# Patient Record
Sex: Male | Born: 1955 | Race: White | Hispanic: No | Marital: Married | Smoking: Former smoker
Health system: Southern US, Community
[De-identification: ages and names within clinical notes are randomized; demographics above are authoritative.]

## PROBLEM LIST (undated history)

## (undated) DIAGNOSIS — K284 Chronic or unspecified gastrojejunal ulcer with hemorrhage: Secondary | ICD-10-CM

## (undated) DIAGNOSIS — K274 Chronic or unspecified peptic ulcer, site unspecified, with hemorrhage: Secondary | ICD-10-CM

## (undated) HISTORY — PX: VASECTOMY: SHX75

---

## 2010-08-28 HISTORY — PX: OTHER SURGICAL HISTORY: SHX169

## 2012-03-25 ENCOUNTER — Emergency Department (HOSPITAL_BASED_OUTPATIENT_CLINIC_OR_DEPARTMENT_OTHER): Payer: BC Managed Care – PPO

## 2012-03-25 ENCOUNTER — Emergency Department (HOSPITAL_BASED_OUTPATIENT_CLINIC_OR_DEPARTMENT_OTHER)
Admission: EM | Admit: 2012-03-25 | Discharge: 2012-03-25 | Disposition: A | Discharge status: Home / Self Care | Payer: BC Managed Care – PPO | Attending: Emergency Medicine | Admitting: Emergency Medicine

## 2012-03-25 ENCOUNTER — Encounter (HOSPITAL_BASED_OUTPATIENT_CLINIC_OR_DEPARTMENT_OTHER): Payer: Self-pay

## 2012-03-25 DIAGNOSIS — F17200 Nicotine dependence, unspecified, uncomplicated: Secondary | ICD-10-CM | POA: Insufficient documentation

## 2012-03-25 DIAGNOSIS — R079 Chest pain, unspecified: Secondary | ICD-10-CM | POA: Insufficient documentation

## 2012-03-25 HISTORY — DX: Chronic or unspecified peptic ulcer, site unspecified, with hemorrhage: K27.4

## 2012-03-25 HISTORY — DX: Chronic or unspecified gastrojejunal ulcer with hemorrhage: K28.4

## 2012-03-25 LAB — CBC WITH DIFFERENTIAL/PLATELET
Eosinophils Relative: 3 % (ref 0–5)
HCT: 40.4 % (ref 39.0–52.0)
Lymphocytes Relative: 33 % (ref 12–46)
Lymphs Abs: 2.2 10*3/uL (ref 0.7–4.0)
MCV: 85.4 fL (ref 78.0–100.0)
Monocytes Absolute: 0.7 10*3/uL (ref 0.1–1.0)
Neutro Abs: 3.6 10*3/uL (ref 1.7–7.7)
RBC: 4.73 MIL/uL (ref 4.22–5.81)
WBC: 6.7 10*3/uL (ref 4.0–10.5)

## 2012-03-25 LAB — BASIC METABOLIC PANEL
CO2: 27 mEq/L (ref 19–32)
Calcium: 9 mg/dL (ref 8.4–10.5)
Chloride: 102 mEq/L (ref 96–112)
Glucose, Bld: 88 mg/dL (ref 70–99)
Sodium: 138 mEq/L (ref 135–145)

## 2012-03-25 LAB — TROPONIN I
Troponin I: <0.3 ng/mL (ref <0.30)
Troponin I: <0.3 ng/mL (ref <0.30)

## 2012-03-25 MED ORDER — NITROGLYCERIN 0.4 MG SL SUBL
0.4000 mg | SUBLINGUAL_TABLET | SUBLINGUAL | Status: DC | PRN
  Administered 2012-03-25: 0.4 mg via SUBLINGUAL
  Filled 2012-03-25: qty 25

## 2012-03-25 MED ORDER — ASPIRIN 81 MG PO CHEW
324.0000 mg | CHEWABLE_TABLET | Freq: Once | ORAL | Status: AC
  Administered 2012-03-25: 324 mg via ORAL
  Filled 2012-03-25: qty 4

## 2012-03-25 NOTE — ED Provider Notes (Signed)
History     CSN: [id number]  Arrival date & time 03/25/12  1208   First MD Initiated Contact with Patient 03/25/12 1228      Chief Complaint  Patient presents with  . Chest Pain    (Consider location/radiation/quality/duration/timing/severity/associated sxs/prior treatment) HPI Patient is a 75 your old male who presents complaining of 2/10 left chest pressure that developed last night around 9 PM and has been persistent since that time. He has no associated shortness of breath, nausea, vomiting, or neurologic changes. Patient denies any abdominal pain, fevers, recent cough, or variation in his pain with deep inspiration. He has no history of coronary artery disease or thromboembolic disease. Patient has no history of hypertension, hyperlipidemia, smoking, diabetes, or early family history of CAD. Nothing has made the pain better or worse. Patient has no prior history of stress testing or evaluation by cardiologist. He came in today as he stated that he just wanted to be checked out. Past Medical History  Diagnosis Date  . Bleeding ulcer     Past Surgical History  Procedure Date  . Bleeding ulcer cauterized     History reviewed. No pertinent family history.  History  Substance Use Topics  . Smoking status: Current Some Day Smoker    Types: Cigars  . Smokeless tobacco: Not on file  . Alcohol Use: Yes      Review of Systems  Constitutional: Negative.   HENT: Negative.   Eyes: Negative.   Respiratory: Negative.   Cardiovascular: Positive for chest pain.  Gastrointestinal: Negative.   Genitourinary: Negative.   Musculoskeletal: Negative.   Skin: Negative.   Neurological: Negative.   Hematological: Negative.   Psychiatric/Behavioral: Negative.   All other systems reviewed and are negative.    Allergies  Review of patient's allergies indicates no known allergies.  Home Medications   Current Outpatient Rx  Name Route Sig Dispense Refill  . NAPROXEN SODIUM 220 MG  PO TABS Oral Take 220 mg by mouth 2 (two) times daily with a meal. Back pain      BP 122/55  Pulse 63  Temp 98.9 F (37.2 C) (Oral)  Resp 16  Ht 5\' 8"  (1.727 m)  Wt 175 lb (79.379 kg)  BMI 26.61 kg/m2  SpO2 100%  Physical Exam  Nursing note and vitals reviewed. GEN: Well-developed, well-nourished male in no distress HEENT: Atraumatic, normocephalic. Oropharynx clear without erythema EYES: PERRLA BL, no scleral icterus. NECK: Trachea midline, no meningismus CV: regular rate and rhythm. No murmurs, rubs, or gallops PULM: No respiratory distress.  No crackles, wheezes, or rales. GI: soft, non-tender. No guarding, rebound, or tenderness. + bowel sounds  GU: deferred Neuro: cranial nerves grossly 2-12 intact, no abnormalities of strength or sensation, A and O x 3 MSK: Patient moves all 4 extremities symmetrically, no deformity, edema, or injury noted Skin: No rashes petechiae, purpura, or jaundice Psych: no abnormality of mood   ED Course  Procedures (including critical care time)  Indication: chest pain Please note this EKG was reviewed extemporaneously by myself.  Date: 03/25/2012  Rate: 74  Rhythm: normal sinus rhythm and sinus arrhythmia  QRS Axis: normal  Intervals: normal  ST/T Wave abnormalities: nonspecific T wave changes  Conduction Disutrbances:none  Narrative Interpretation:   Old EKG Reviewed: none available     Labs Reviewed  CBC WITH DIFFERENTIAL - Abnormal; Notable for the following:    MCHC 36.1 (*)     All other components within normal limits  BASIC METABOLIC PANEL  TROPONIN I  TROPONIN I   Dg Chest 2 View  03/25/2012  *RADIOLOGY REPORT*  Clinical Data: chest tightness  CHEST - 2 VIEW  Comparison: None.  Findings: The heart size and mediastinal contours are within normal limits.  Both lungs are clear.  The visualized skeletal structures are unremarkable.  IMPRESSION: Negative exam.  Original Report Authenticated By: Rosealee Albee, M.D.      1. Chest pain       MDM  Patient was evaluated by myself. He was hemodynamically stable with only 2/10 left sided chest pain. He was given a dose of aspirin 1 tab of nitroglycerin. Initial laboratory workup for ACS was negative. Chest x-ray was clear as well and EKG showed no concerning changes. Patient has no significant risk factors aside from his age and being male for coronary artery disease. Patient had had constant slight left-sided chest pain which did not fit with typical ACS. Patient did not have presentation concerning for thromboembolic disease. At this time 3 hour troponins pending. If this returned negative patient would be safe for discharge to home with followup with his primary care physician. He's been notified of plan. Three-hour troponin will be due at 3:30 PM.  4:08 PM 3 arch was negative. Patient pain free. He was advised to follow his primary care physician regarding stress testing. If he develops chest pain again he is welcome to return for further evaluation. Patient I discussed the risk chest pain protocol admission and patient preferred outpatient followup with his established primary care physician.  Cyndra Numbers, MD 03/25/12 [phone]

## 2012-03-25 NOTE — ED Notes (Signed)
Left side CP started last night 9pm-pt was able to sleep but woke with pain this am-denies sob, n/v, diaphoresis

## 2018-09-13 ENCOUNTER — Ambulatory Visit (HOSPITAL_BASED_OUTPATIENT_CLINIC_OR_DEPARTMENT_OTHER)
Admission: RE | Admit: 2018-09-13 | Discharge: 2018-09-13 | Disposition: A | Discharge status: Home / Self Care | Payer: 59 | Source: Ambulatory Visit | Attending: Osteopathic Medicine | Admitting: Osteopathic Medicine

## 2018-09-13 ENCOUNTER — Encounter (HOSPITAL_BASED_OUTPATIENT_CLINIC_OR_DEPARTMENT_OTHER): Payer: Self-pay

## 2018-09-13 ENCOUNTER — Other Ambulatory Visit (HOSPITAL_BASED_OUTPATIENT_CLINIC_OR_DEPARTMENT_OTHER): Payer: Self-pay | Admitting: Osteopathic Medicine

## 2018-09-13 DIAGNOSIS — N5082 Scrotal pain: Secondary | ICD-10-CM

## 2018-09-13 DIAGNOSIS — N5089 Other specified disorders of the male genital organs: Secondary | ICD-10-CM

## 2021-04-10 ENCOUNTER — Emergency Department (HOSPITAL_BASED_OUTPATIENT_CLINIC_OR_DEPARTMENT_OTHER)
Admission: EM | Admit: 2021-04-10 | Discharge: 2021-04-10 | Disposition: A | Discharge status: Home / Self Care | Payer: 59 | Attending: Emergency Medicine | Admitting: Emergency Medicine

## 2021-04-10 ENCOUNTER — Other Ambulatory Visit: Payer: Self-pay

## 2021-04-10 ENCOUNTER — Encounter (HOSPITAL_BASED_OUTPATIENT_CLINIC_OR_DEPARTMENT_OTHER): Payer: Self-pay

## 2021-04-10 ENCOUNTER — Emergency Department (HOSPITAL_BASED_OUTPATIENT_CLINIC_OR_DEPARTMENT_OTHER): Payer: 59

## 2021-04-10 DIAGNOSIS — I10 Essential (primary) hypertension: Secondary | ICD-10-CM

## 2021-04-10 DIAGNOSIS — R0781 Pleurodynia: Secondary | ICD-10-CM | POA: Insufficient documentation

## 2021-04-10 DIAGNOSIS — Z87891 Personal history of nicotine dependence: Secondary | ICD-10-CM | POA: Diagnosis not present

## 2021-04-10 DIAGNOSIS — R059 Cough, unspecified: Secondary | ICD-10-CM | POA: Diagnosis not present

## 2021-04-10 LAB — CBC WITH DIFFERENTIAL/PLATELET
Abs Immature Granulocytes: 0.03 10*3/uL (ref 0.00–0.07)
Basophils Absolute: 0.1 10*3/uL (ref 0.0–0.1)
Basophils Relative: 1 %
Eosinophils Absolute: 0.2 10*3/uL (ref 0.0–0.5)
Eosinophils Relative: 3 %
HCT: 41.5 % (ref 39.0–52.0)
Hemoglobin: 14.7 g/dL (ref 13.0–17.0)
Immature Granulocytes: 0 %
Lymphocytes Relative: 20 %
Lymphs Abs: 1.5 10*3/uL (ref 0.7–4.0)
MCH: 30.9 pg (ref 26.0–34.0)
MCHC: 35.4 g/dL (ref 30.0–36.0)
MCV: 87.4 fL (ref 80.0–100.0)
Monocytes Absolute: 1.1 10*3/uL — ABNORMAL HIGH (ref 0.1–1.0)
Monocytes Relative: 14 %
Neutro Abs: 4.5 10*3/uL (ref 1.7–7.7)
Neutrophils Relative %: 62 %
Platelets: 205 10*3/uL (ref 150–400)
RBC: 4.75 MIL/uL (ref 4.22–5.81)
RDW: 12.3 % (ref 11.5–15.5)
WBC: 7.3 10*3/uL (ref 4.0–10.5)
nRBC: 0 % (ref 0.0–0.2)

## 2021-04-10 LAB — URINALYSIS, ROUTINE W REFLEX MICROSCOPIC
Bilirubin Urine: NEGATIVE
Glucose, UA: NEGATIVE mg/dL
Ketones, ur: NEGATIVE mg/dL
Leukocytes,Ua: NEGATIVE
Nitrite: NEGATIVE
Protein, ur: NEGATIVE mg/dL
Specific Gravity, Urine: <1.005 — ABNORMAL LOW (ref 1.005–1.030)
pH: 6 (ref 5.0–8.0)

## 2021-04-10 LAB — COMPREHENSIVE METABOLIC PANEL
ALT: 22 U/L (ref 0–44)
AST: 27 U/L (ref 15–41)
Albumin: 4.2 g/dL (ref 3.5–5.0)
Alkaline Phosphatase: 50 U/L (ref 38–126)
Anion gap: 8 (ref 5–15)
BUN: 11 mg/dL (ref 8–23)
CO2: 28 mmol/L (ref 22–32)
Calcium: 8.4 mg/dL — ABNORMAL LOW (ref 8.9–10.3)
Chloride: 101 mmol/L (ref 98–111)
Creatinine, Ser: 0.67 mg/dL (ref 0.61–1.24)
GFR, Estimated: >60 mL/min (ref >60)
Glucose, Bld: 83 mg/dL (ref 70–99)
Potassium: 4 mmol/L (ref 3.5–5.1)
Sodium: 137 mmol/L (ref 135–145)
Total Bilirubin: 0.4 mg/dL (ref 0.3–1.2)
Total Protein: 7.4 g/dL (ref 6.5–8.1)

## 2021-04-10 LAB — URINALYSIS, MICROSCOPIC (REFLEX)

## 2021-04-10 LAB — TROPONIN I (HIGH SENSITIVITY)
Troponin I (High Sensitivity): 3 ng/L (ref <18)
Troponin I (High Sensitivity): 3 ng/L (ref <18)

## 2021-04-10 MED ORDER — ACETAMINOPHEN-CODEINE 120-12 MG/5ML PO SOLN: 5.0000 mL | Freq: Four times a day (QID) | ORAL | 0 refills | Status: DC | PRN

## 2021-04-10 MED ORDER — BENZONATATE 100 MG PO CAPS
100.0000 mg | ORAL_CAPSULE | Freq: Once | ORAL | Status: AC
  Administered 2021-04-10: 100 mg via ORAL
  Filled 2021-04-10: qty 1

## 2021-04-10 MED ORDER — HYDRALAZINE HCL 20 MG/ML IJ SOLN
10.0000 mg | Freq: Once | INTRAMUSCULAR | Status: AC
  Administered 2021-04-10: 10 mg via INTRAVENOUS
  Filled 2021-04-10: qty 1

## 2021-04-10 MED ORDER — GUAIFENESIN 100 MG/5ML PO LIQD: 100.0000 mg | ORAL | 0 refills | Status: DC | PRN

## 2021-04-10 MED ORDER — IOHEXOL 350 MG/ML SOLN
100.0000 mL | Freq: Once | INTRAVENOUS | Status: AC | PRN
  Administered 2021-04-10: 100 mL via INTRAVENOUS

## 2021-04-10 MED ORDER — MORPHINE SULFATE (PF) 4 MG/ML IV SOLN
4.0000 mg | Freq: Once | INTRAVENOUS | Status: AC
  Administered 2021-04-10: 4 mg via INTRAVENOUS
  Filled 2021-04-10: qty 1

## 2021-04-10 NOTE — ED Provider Notes (Signed)
[facility] EMERGENCY DEPARTMENT Provider Note   CSN: [id number] Arrival date & time: 04/10/21  1806     History Chief Complaint  Patient presents with   Cough    Jonathan Velasquez is a 65 y.o. male history of bleeding ulcer, here presenting with cough and chest pain and rib pain.  Patient states that he has been having nonproductive cough for the last week or so.  He did test himself for COVID and was -4 days ago. He states that today he had a coughing spell and had a sudden onset of left chest pain.  He states that it radiates to his back.  He states that he just felt very uncomfortable.  Denies any urinary symptoms.  Denies any history of CAD or stents.  The history is provided by the patient.      Past Medical History:  Diagnosis Date   Bleeding ulcer     There are no problems to display for this patient.   Past Surgical History:  Procedure Laterality Date   bleeding ulcer cauterized  2012   VASECTOMY         No family history on file.  Social History   Tobacco Use   Smoking status: Former    Packs/day: 0.01    Types: Cigars, Cigarettes    Quit date: 2014    Years since quitting: 8.6   Smokeless tobacco: Never   Tobacco comments:    one cigar a month  Vaping Use   Vaping Use: Never used  Substance Use Topics   Alcohol use: Yes    Comment: socially   Drug use: No    Home Medications Prior to Admission medications   Medication Sig Start Date End Date Taking? Authorizing Provider  naproxen sodium (ANAPROX) 220 MG tablet Take 220 mg by mouth 2 (two) times daily with a meal. Back pain    [provider]    Allergies    Patient has no known allergies.  Review of Systems   Review of Systems  Respiratory:  Positive for cough.   Cardiovascular:  Positive for chest pain.  All other systems reviewed and are negative.  Physical Exam Updated Vital Signs BP 113/71   Pulse 85   Temp 99.1 F (37.3 C) (Oral)   Resp 15   Ht 5\' 6"  (1.676  m)   Wt 81.6 kg   SpO2 96%   BMI 29.05 kg/m   Physical Exam Vitals and nursing note reviewed.  Constitutional:      Comments: Uncomfortable  HENT:     Head: Normocephalic.     Nose: Nose normal.     Mouth/Throat:     Mouth: Mucous membranes are moist.  Eyes:     Extraocular Movements: Extraocular movements intact.     Pupils: Pupils are equal, round, and reactive to light.  Cardiovascular:     Rate and Rhythm: Normal rate and regular rhythm.     Pulses: Normal pulses.     Heart sounds: Normal heart sounds.  Pulmonary:     Effort: Pulmonary effort is normal.     Breath sounds: Normal breath sounds.     Comments: Reproducible left chest wall and back tenderness Abdominal:     General: Abdomen is flat.     Palpations: Abdomen is soft.  Musculoskeletal:        General: Normal range of motion.     Cervical back: Normal range of motion and neck supple.  Skin:  General: Skin is warm.     Capillary Refill: Capillary refill takes less than 2 seconds.  Neurological:     General: No focal deficit present.     Mental Status: He is oriented to person, place, and time.  Psychiatric:        Mood and Affect: Mood normal.        Behavior: Behavior normal.    ED Results / Procedures / Treatments   Labs (all labs ordered are listed, but only abnormal results are displayed) Labs Reviewed  CBC WITH DIFFERENTIAL/PLATELET - Abnormal; Notable for the following components:      Result Value   Monocytes Absolute 1.1 (*)    All other components within normal limits  COMPREHENSIVE METABOLIC PANEL - Abnormal; Notable for the following components:   Calcium 8.4 (*)    All other components within normal limits  URINALYSIS, ROUTINE W REFLEX MICROSCOPIC - Abnormal; Notable for the following components:   Color, Urine STRAW (*)    Specific Gravity, Urine <1.005 (*)    Hgb urine dipstick SMALL (*)    All other components within normal limits  URINALYSIS, MICROSCOPIC (REFLEX) - Abnormal;  Notable for the following components:   Bacteria, UA FEW (*)    All other components within normal limits  TROPONIN I (HIGH SENSITIVITY)  TROPONIN I (HIGH SENSITIVITY)    EKG EKG Interpretation  Date/Time:  Sunday April 10 2021 20:08:34 EDT Ventricular Rate:  68 PR Interval:  119 QRS Duration: 104 QT Interval:  420 QTC Calculation: 447 R Axis:   -3 Text Interpretation: Sinus rhythm Borderline short PR interval Minimal ST elevation, inferior leads No significant change since last tracing Confirmed by Richardean Canal [phone]) on 04/10/2021 8:12:04 PM  Radiology CT Angio Chest/Abd/Pel for Dissection W and/or Wo Contrast  Result Date: 04/10/2021 CLINICAL DATA:  Abdominal pain, aortic dissection suspected EXAM: CT ANGIOGRAPHY CHEST, ABDOMEN AND PELVIS TECHNIQUE: Non-contrast CT of the chest was initially obtained. Multidetector CT imaging through the chest, abdomen and pelvis was performed using the standard protocol during bolus administration of intravenous contrast. Multiplanar reconstructed images and MIPs were obtained and reviewed to evaluate the vascular anatomy. CONTRAST:  OMNIPAQUE IOHEXOL 350 MG/ML SOLN COMPARISON:  None. FINDINGS: CTA CHEST FINDINGS Cardiovascular: --Heart: The heart size is normal.  There is nopericardial effusion. --Aorta: The course and caliber of the thoracic aorta are normal. There is mild aortic atherosclerotic calcification. Precontrast images show no aortic intramural hematoma. There is no blood pool, dissection or penetrating ulcer demonstrated on arterial phase postcontrast imaging. There is a conventional 3 vessel aortic arch branching pattern. The proximal arch vessels are widely patent. --Pulmonary Arteries: Contrast timing is optimized for preferential opacification of the aorta. Within that limitation, normal central pulmonary arteries. Mediastinum/Nodes: No mediastinal, hilar or axillary lymphadenopathy. The visualized thyroid and thoracic esophageal  course are unremarkable. Lungs/Pleura: No pulmonary nodules or masses. No pleural effusion or pneumothorax. No focal airspace consolidation. No focal pleural abnormality. Musculoskeletal: No chest wall abnormality. No acute osseous findings. Review of the MIP images confirms the above findings. CTA ABDOMEN AND PELVIS FINDINGS VASCULAR Aorta: Normal caliber aorta without aneurysm, dissection, vasculitis or hemodynamically significant stenosis. There is no aortic atherosclerosis. Celiac: No aneurysm, dissection or hemodynamically significant stenosis. Normal branching pattern. SMA: Widely patent without dissection or stenosis. Renals: Single renal arteries bilaterally. No aneurysm, dissection, stenosis or evidence of fibromuscular dysplasia. IMA: Patent without abnormality. Inflow: No aneurysm, stenosis or dissection. Veins: Normal course and caliber of the major  veins. Assessment is otherwise limited by the arterial dominant contrast phase. Review of the MIP images confirms the above findings. NON-VASCULAR Hepatobiliary: Normal hepatic contours and density. No visible biliary dilatation. Normal gallbladder. Pancreas: Normal contours without ductal dilatation. No peripancreatic fluid collection. Spleen: Normal arterial phase splenic enhancement pattern. Adrenals/Urinary Tract: --Adrenal glands: Normal. --Right kidney/ureter: No hydronephrosis or perinephric stranding. No nephrolithiasis. No obstructing ureteral stones. --Left kidney/ureter: No hydronephrosis or perinephric stranding. No nephrolithiasis. No obstructing ureteral stones. --Urinary bladder: Unremarkable. Stomach/Bowel: --Stomach/Duodenum: No hiatal hernia or other gastric abnormality. Normal duodenal course and caliber. --Small bowel: No dilatation or inflammation. --Colon: No focal abnormality. --Appendix: Normal. Lymphatic:  No abdominal or pelvic lymphadenopathy. Reproductive: No free fluid in the pelvis. Musculoskeletal. No bony spinal canal stenosis  or focal osseous abnormality. Other: None. Review of the MIP images confirms the above findings. findings IMPRESSION: No acute aortic syndrome or other acute abnormality of the chest, abdomen or pelvis. Aortic atherosclerosis (ICD10-I70.0). Electronically Signed   By: Deatra Robinson M.D.   On: 04/10/2021 21:16    Procedures Procedures   Medications Ordered in ED Medications  hydrALAZINE (APRESOLINE) injection 10 mg (10 mg Intravenous Given 04/10/21 2008)  morphine 4 MG/ML injection 4 mg (4 mg Intravenous Given 04/10/21 2008)  benzonatate (TESSALON) capsule 100 mg (100 mg Oral Given 04/10/21 2036)  iohexol (OMNIPAQUE) 350 MG/ML injection 100 mL (100 mLs Intravenous Contrast Given 04/10/21 2047)    ED Course  I have reviewed the triage vital signs and the nursing notes.  Pertinent labs & imaging results that were available during my care of the patient were reviewed by me and considered in my medical decision making (see chart for details).    MDM Rules/Calculators/A&P                          DALLAS TOROK is a 65 y.o. male here presenting with left-sided chest pain after cough.  I think likely muscle strain.  However patient is hypertensive and does not have a history of hypertension.  He also appears very comfortable.  I am concerned for possible dissection versus renal colic.  We will get CT dissection study and CBC and CMP and troponin and urinalysis.  Will give pain medicine and hydralazine and reassess   10:53 PM Blood pressure down to 113/71 now.  Troponin negative x2.  Dissection study negative, urinalysis normal.  At this point, I think he can be discharged.  We will give him Tylenol with codeine to help with pain and also coughing.  We will have him follow-up with PCP regarding his blood pressure  Final Clinical Impression(s) / ED Diagnoses Final diagnoses:  None    Rx / DC Orders ED Discharge Orders     None        Charlynne Pander, MD 04/10/21 2254

## 2021-04-10 NOTE — ED Triage Notes (Signed)
Pt arrives ambulatory to ED with c/o cough X1 week states today he coughed and sneezed at the same time states he felt a pop and has had pain to left back rib since.

## 2021-04-10 NOTE — Discharge Instructions (Addendum)
Take Tylenol with codeine for cough and pain.  Do not drive with it   Your elevated blood pressure is likely from pain and coughing.  Please recheck blood pressure with your doctor in a week  Your heart enzyme tests are normal today  See your doctor  Return to ER if you have worse cough, trouble breathing, chest pain, back pain

## 2022-01-30 ENCOUNTER — Emergency Department (INDEPENDENT_AMBULATORY_CARE_PROVIDER_SITE_OTHER)
Admission: EM | Admit: 2022-01-30 | Discharge: 2022-01-30 | Disposition: A | Discharge status: Home / Self Care | Payer: 59 | Source: Home / Self Care

## 2022-01-30 ENCOUNTER — Encounter: Payer: Self-pay | Admitting: Emergency Medicine

## 2022-01-30 DIAGNOSIS — S81812A Laceration without foreign body, left lower leg, initial encounter: Secondary | ICD-10-CM | POA: Diagnosis not present

## 2022-01-30 NOTE — ED Provider Notes (Signed)
[facility]    CSN: [id number] Arrival date & time: 01/30/22  1647      History   Chief Complaint Chief Complaint  Patient presents with   Laceration    HPI Jonathan Velasquez is a 66 y.o. male.   HPI Patient presents today for evaluation of laceration above left knee.  Patient was cutting hedges with hedge trimmers and accidentally allowed the hedge trimmer blades to make contact with his left upper leg.  Bleeding is well controlled. Patient endorses recent TDAP.  Past Medical History:  Diagnosis Date   Bleeding ulcer     There are no problems to display for this patient.   Past Surgical History:  Procedure Laterality Date   bleeding ulcer cauterized  2012   VASECTOMY         Home Medications    Prior to Admission medications   Medication Sig Start Date End Date Taking? Authorizing Provider  atorvastatin (LIPITOR) 20 MG tablet Take 20 mg by mouth at bedtime. 12/27/21  Yes [provider]  dutasteride (AVODART) 0.5 MG capsule Take 0.5 mg by mouth daily. 12/27/21  Yes [provider]  guaiFENesin (ROBITUSSIN) 100 MG/5ML liquid Take 5-10 mLs (100-200 mg total) by mouth every 4 (four) hours as needed for cough. 04/10/21   Sabas Sous, MD  naproxen sodium (ANAPROX) 220 MG tablet Take 220 mg by mouth 2 (two) times daily with a meal. Back pain    [provider]    Family History History reviewed. No pertinent family history.  Social History Social History   Tobacco Use   Smoking status: Former    Packs/day: 0.01    Types: Cigars, Cigarettes    Quit date: 2014    Years since quitting: 9.4   Smokeless tobacco: Never   Tobacco comments:    one cigar a month  Vaping Use   Vaping Use: Never used  Substance Use Topics   Alcohol use: Yes    Comment: socially   Drug use: No     Allergies   Patient has no known allergies.   Review of Systems Review of Systems Pertinent negatives listed in HPI   Physical Exam Triage  Vital Signs ED Triage Vitals  Enc Vitals Group     BP 01/30/22 1709 (!) 147/93     Pulse Rate 01/30/22 1709 61     Resp 01/30/22 1709 18     Temp 01/30/22 1709 98.3 F (36.8 C)     Temp Source 01/30/22 1709 Oral     SpO2 01/30/22 1709 99 %     Weight 01/30/22 1711 180 lb (81.6 kg)     Height 01/30/22 1711 5\' 7"  (1.702 m)     Head Circumference --      Peak Flow --      Pain Score 01/30/22 1710 1     Pain Loc --      Pain Edu? --      Excl. in GC? --    No data found.  Updated Vital Signs BP (!) 147/93 (BP Location: Left Arm)   Pulse 61   Temp 98.3 F (36.8 C) (Oral)   Resp 18   Ht 5\' 7"  (1.702 m)   Wt 180 lb (81.6 kg)   SpO2 99%   BMI 28.19 kg/m   Visual Acuity Right Eye Distance:   Left Eye Distance:   Bilateral Distance:    Right Eye Near:   Left Eye Near:  Bilateral Near:     Physical Exam Constitutional:      Appearance: Normal appearance.  Cardiovascular:     Rate and Rhythm: Normal rate and regular rhythm.  Pulmonary:     Effort: Pulmonary effort is normal.     Breath sounds: Normal breath sounds.  Musculoskeletal:       Legs:  Neurological:     General: No focal deficit present.     Mental Status: He is alert.     GCS: GCS eye subscore is 4. GCS verbal subscore is 5. GCS motor subscore is 6.     Sensory: Sensation is intact.     Motor: Motor function is intact.  Psychiatric:        Attention and Perception: Attention normal.        Mood and Affect: Mood normal.        Speech: Speech normal.     UC Treatments / Results  Labs (all labs ordered are listed, but only abnormal results are displayed) Labs Reviewed - No data to display  EKG   Radiology No results found.  Procedures Laceration Repair  Date/Time: 01/30/2022 5:40 PM Performed by: Bing Neighbors, FNP Authorized by: Bing Neighbors, FNP   Consent:    Consent obtained:  Verbal   Consent given by:  Patient   Risks discussed:  Infection   Alternatives discussed:   No treatment Universal protocol:    Patient identity confirmed:  Verbally with patient Anesthesia:    Anesthesia method:  None Laceration details:    Location:  Leg   Leg location:  L knee   Length (cm):  5   Depth (mm):  1 Treatment:    Area cleansed with:  Povidone-iodine   Irrigation solution:  Tap water   Debridement:  None Skin repair:    Repair method:  Sutures   Suture size:  3-0   Suture material:  Prolene   Number of sutures:  3 Approximation:    Approximation:  Close Repair type:    Repair type:  Simple Post-procedure details:    Dressing:  Non-adherent dressing   Procedure completion:  Tolerated (including critical care time)  Medications Ordered in UC Medications - No data to display  Initial Impression / Assessment and Plan / UC Course  I have reviewed the triage vital signs and the nursing notes.  Pertinent labs & imaging results that were available during my care of the patient were reviewed by me and considered in my medical decision making (see chart for details).    Laceration of left lower leg above the knee  Repaired without difficulty. Return in 7 days for suture removal. Bleeding controlled, patient ambulatory and stable. Final Clinical Impressions(s) / UC Diagnoses   Final diagnoses:  Laceration of left lower extremity, initial encounter   Discharge Instructions   None    ED Prescriptions   None    PDMP not reviewed this encounter.   Bing Neighbors, FNP 01/30/22 [phone]

## 2022-01-30 NOTE — ED Triage Notes (Signed)
Patient cut left leg today while trimming his hedges.  Patient states that his Tdap is current.

## 2022-03-30 DIAGNOSIS — L570 Actinic keratosis: Secondary | ICD-10-CM | POA: Diagnosis not present

## 2022-03-30 DIAGNOSIS — L821 Other seborrheic keratosis: Secondary | ICD-10-CM | POA: Diagnosis not present

## 2022-03-30 DIAGNOSIS — L820 Inflamed seborrheic keratosis: Secondary | ICD-10-CM | POA: Diagnosis not present

## 2022-03-30 DIAGNOSIS — L578 Other skin changes due to chronic exposure to nonionizing radiation: Secondary | ICD-10-CM | POA: Diagnosis not present

## 2022-03-30 DIAGNOSIS — L814 Other melanin hyperpigmentation: Secondary | ICD-10-CM | POA: Diagnosis not present

## 2022-03-30 DIAGNOSIS — D225 Melanocytic nevi of trunk: Secondary | ICD-10-CM | POA: Diagnosis not present

## 2022-08-16 ENCOUNTER — Ambulatory Visit
Admission: EM | Admit: 2022-08-16 | Discharge: 2022-08-16 | Disposition: A | Discharge status: Home / Self Care | Payer: 59 | Attending: Family Medicine | Admitting: Family Medicine

## 2022-08-16 ENCOUNTER — Encounter: Payer: Self-pay | Admitting: Emergency Medicine

## 2022-08-16 DIAGNOSIS — R059 Cough, unspecified: Secondary | ICD-10-CM | POA: Diagnosis not present

## 2022-08-16 DIAGNOSIS — U071 COVID-19: Secondary | ICD-10-CM

## 2022-08-16 DIAGNOSIS — J3489 Other specified disorders of nose and nasal sinuses: Secondary | ICD-10-CM

## 2022-08-16 MED ORDER — PREDNISONE 20 MG PO TABS: ORAL_TABLET | ORAL | 0 refills | Status: DC

## 2022-08-16 MED ORDER — MOLNUPIRAVIR EUA 200MG CAPSULE: 4.0000 | ORAL_CAPSULE | Freq: Two times a day (BID) | ORAL | 0 refills | Status: AC

## 2022-08-16 MED ORDER — BENZONATATE 100 MG PO CAPS: 100.0000 mg | ORAL_CAPSULE | Freq: Three times a day (TID) | ORAL | 0 refills | Status: DC

## 2022-08-16 MED ORDER — PROMETHAZINE-DM 6.25-15 MG/5ML PO SYRP: 5.0000 mL | ORAL_SOLUTION | Freq: Two times a day (BID) | ORAL | 0 refills | Status: DC | PRN

## 2022-08-16 NOTE — ED Provider Notes (Signed)
[facility]    CSN: [id number] Arrival date & time: 08/16/22  0840      History   Chief Complaint Chief Complaint  Patient presents with   Cough    HPI Jonathan Velasquez is a 66 y.o. male.   HPI 66 year old male presents with cough for 4 days.  Reports nasal congestion and tested positive for COVID last night.  Past Medical History:  Diagnosis Date   Bleeding ulcer     There are no problems to display for this patient.   Past Surgical History:  Procedure Laterality Date   bleeding ulcer cauterized  2012   VASECTOMY         Home Medications    Prior to Admission medications   Medication Sig Start Date End Date Taking? Authorizing Provider  atorvastatin (LIPITOR) 20 MG tablet Take 20 mg by mouth at bedtime. 12/27/21  Yes [provider]  dutasteride (AVODART) 0.5 MG capsule Take 0.5 mg by mouth daily. 12/27/21  Yes [provider]  benzonatate (TESSALON) 100 MG capsule Take 1 capsule (100 mg total) by mouth every 8 (eight) hours. 08/16/22  Yes Trevor Iha, FNP  guaiFENesin (ROBITUSSIN) 100 MG/5ML liquid Take 5-10 mLs (100-200 mg total) by mouth every 4 (four) hours as needed for cough. 04/10/21   Sabas Sous, MD  molnupiravir EUA (LAGEVRIO) 200 mg CAPS capsule Take 4 capsules (800 mg total) by mouth 2 (two) times daily for 5 days. 08/16/22 08/21/22 Yes Trevor Iha, FNP  naproxen sodium (ANAPROX) 220 MG tablet Take 220 mg by mouth 2 (two) times daily with a meal. Back pain    [provider]  predniSONE (DELTASONE) 20 MG tablet Take 3 tabs PO daily x 5 days. 08/16/22  Yes Trevor Iha, FNP  promethazine-dextromethorphan (PROMETHAZINE-DM) 6.25-15 MG/5ML syrup Take 5 mLs by mouth 2 (two) times daily as needed for cough. 08/16/22  Yes Trevor Iha, FNP    Family History History reviewed. No pertinent family history.  Social History Social History   Tobacco Use   Smoking status: Former    Packs/day: 0.01    Types:  Cigars, Cigarettes    Quit date: 2014    Years since quitting: 9.9   Smokeless tobacco: Never   Tobacco comments:    one cigar a month  Vaping Use   Vaping Use: Never used  Substance Use Topics   Alcohol use: Yes    Comment: socially   Drug use: No     Allergies   Patient has no known allergies.   Review of Systems Review of Systems  HENT:  Positive for congestion.   Respiratory:  Positive for cough.   All other systems reviewed and are negative.    Physical Exam Triage Vital Signs ED Triage Vitals  Enc Vitals Group     BP 08/16/22 0901 (!) 154/87     Pulse Rate 08/16/22 0901 63     Resp --      Temp 08/16/22 0901 98.7 F (37.1 C)     Temp Source 08/16/22 0901 Oral     SpO2 08/16/22 0901 94 %     Weight 08/16/22 0904 180 lb (81.6 kg)     Height 08/16/22 0904 5\' 7"  (1.702 m)     Head Circumference --      Peak Flow --      Pain Score 08/16/22 0904 2     Pain Loc --      Pain Edu? --  Excl. in GC? --    No data found.  Updated Vital Signs BP (!) 154/87 (BP Location: Left Arm)   Pulse 63   Temp 98.7 F (37.1 C) (Oral)   Ht 5\' 7"  (1.702 m)   Wt 180 lb (81.6 kg)   SpO2 94%   BMI 28.19 kg/m   Physical Exam Vitals and nursing note reviewed.  Constitutional:      Appearance: Normal appearance. He is normal weight. He is ill-appearing.  HENT:     Head: Normocephalic and atraumatic.     Right Ear: Tympanic membrane, ear canal and external ear normal.     Left Ear: Tympanic membrane, ear canal and external ear normal.     Mouth/Throat:     Mouth: Mucous membranes are moist.     Pharynx: Oropharynx is clear.  Eyes:     Extraocular Movements: Extraocular movements intact.     Conjunctiva/sclera: Conjunctivae normal.     Pupils: Pupils are equal, round, and reactive to light.  Cardiovascular:     Rate and Rhythm: Normal rate and regular rhythm.     Pulses: Normal pulses.     Heart sounds: Normal heart sounds.  Pulmonary:     Effort: Pulmonary  effort is normal.     Breath sounds: Normal breath sounds. No wheezing, rhonchi or rales.  Musculoskeletal:        General: Normal range of motion.     Cervical back: Normal range of motion and neck supple. No tenderness.  Lymphadenopathy:     Cervical: No cervical adenopathy.  Skin:    General: Skin is warm and dry.  Neurological:     General: No focal deficit present.     Mental Status: He is alert and oriented to person, place, and time. Mental status is at baseline.      UC Treatments / Results  Labs (all labs ordered are listed, but only abnormal results are displayed) Labs Reviewed - No data to display  EKG   Radiology No results found.  Procedures Procedures (including critical care time)  Medications Ordered in UC Medications - No data to display  Initial Impression / Assessment and Plan / UC Course  I have reviewed the triage vital signs and the nursing notes.  Pertinent labs & imaging results that were available during my care of the patient were reviewed by me and considered in my medical decision making (see chart for details).     MDM: 1.  COVID-19-Rx'd Molnupiravir; 2.  Cough-Rx'd Tessalon, Promethazine DM; 3.  Sinus pressure-Rx'd prednisone. Advised patient to take medication as directed with food to completion.  Advised may take Tessalon daily or as needed for cough.  Advised may take Promethazine DM for cough at night prior to sleep due to sedative effects.  Instructed patient not to take cough medications together.  Encouraged to increase daily water intake to 64 ounces per day while taking these medications.  Advised if symptoms worsen and/or unresolved please follow-up with PCP or here for further evaluation.  Encourage patient to remain self quarantine for the next 5 days. Discharged home, hemodynamically stable. Final Clinical Impressions(s) / UC Diagnoses   Final diagnoses:  COVID-19  Cough, unspecified type  Sinus pressure     Discharge  Instructions      Advised patient to take medication as directed with food to completion.  Advised may take Tessalon daily or as needed for cough.  Advised may take Promethazine DM for cough at night prior to sleep  due to sedative effects.  Instructed patient not to take cough medications together.  Encouraged to increase daily water intake to 64 ounces per day while taking these medications.  Advised if symptoms worsen and/or unresolved please follow-up with PCP or here for further evaluation.     ED Prescriptions     Medication Sig Dispense Auth. Provider   molnupiravir EUA (LAGEVRIO) 200 mg CAPS capsule Take 4 capsules (800 mg total) by mouth 2 (two) times daily for 5 days. 40 capsule Trevor Iha, FNP   benzonatate (TESSALON) 100 MG capsule Take 1 capsule (100 mg total) by mouth every 8 (eight) hours. 21 capsule Trevor Iha, FNP   promethazine-dextromethorphan (PROMETHAZINE-DM) 6.25-15 MG/5ML syrup Take 5 mLs by mouth 2 (two) times daily as needed for cough. 118 mL Trevor Iha, FNP   predniSONE (DELTASONE) 20 MG tablet Take 3 tabs PO daily x 5 days. 15 tablet Trevor Iha, FNP      PDMP not reviewed this encounter.   Trevor Iha, FNP 08/16/22 1016

## 2022-08-16 NOTE — Discharge Instructions (Addendum)
Advised patient to take medication as directed with food to completion.  Advised may take Tessalon daily or as needed for cough.  Advised may take Promethazine DM for cough at night prior to sleep due to sedative effects.  Instructed patient not to take cough medications together.  Encouraged to increase daily water intake to 64 ounces per day while taking these medications.  Advised if symptoms worsen and/or unresolved please follow-up with PCP or here for further evaluation.

## 2022-08-16 NOTE — ED Triage Notes (Signed)
Patient states he started feeling bad on Sunday with a cough, congestion, tested positive for COVID at home last night.  Patient has taken an OTC expectant, decongestant, saline and Tylenol.

## 2023-07-04 IMAGING — CT CT ANGIO CHEST-ABD-PELV FOR DISSECTION W/ AND WO/W CM
2 of 9 series · 13 of 46 positions shown, 15 images · non-contrast
Comparison: None.

CLINICAL DATA: Abdominal pain, aortic dissection suspected

EXAM:
CT ANGIOGRAPHY CHEST, ABDOMEN AND PELVIS
TECHNIQUE: Non-contrast CT of the chest was initially obtained.

[Series 5: axial arterial · axial · arterial · 0.98mm/px · z∈[-578,+10]mm · 10 of 220 slices shown, 12 images]
[im 12/220  soft-tissue]
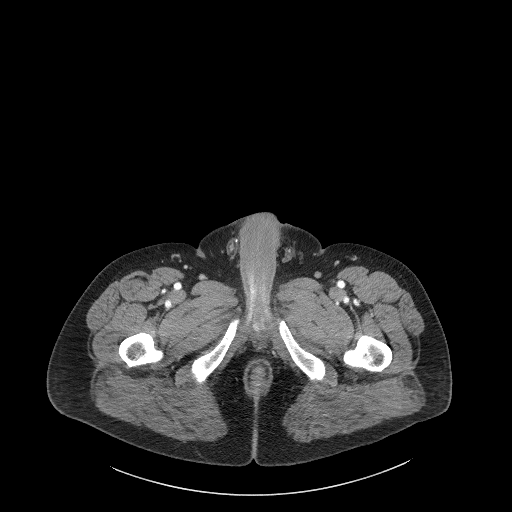
[im 12/220  bone]
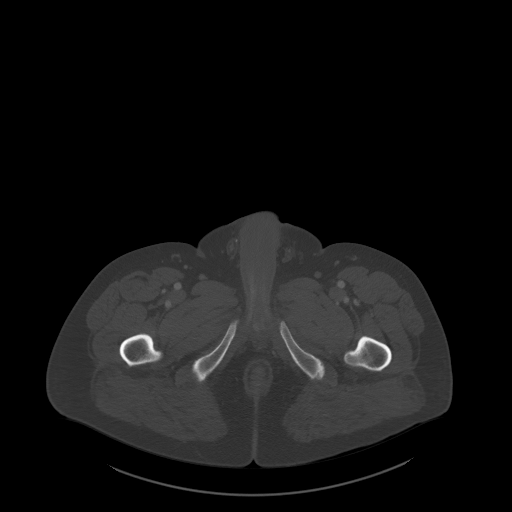
[im 35/220  soft-tissue]
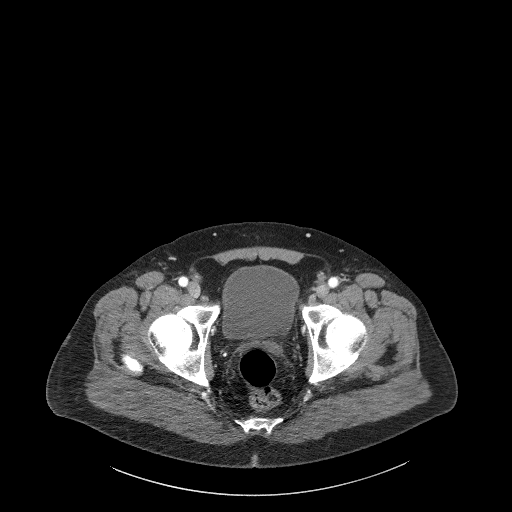
[im 58/220  soft-tissue]
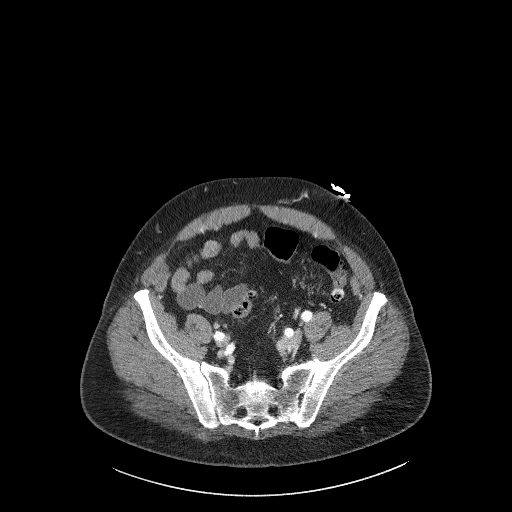
[im 81/220  soft-tissue]
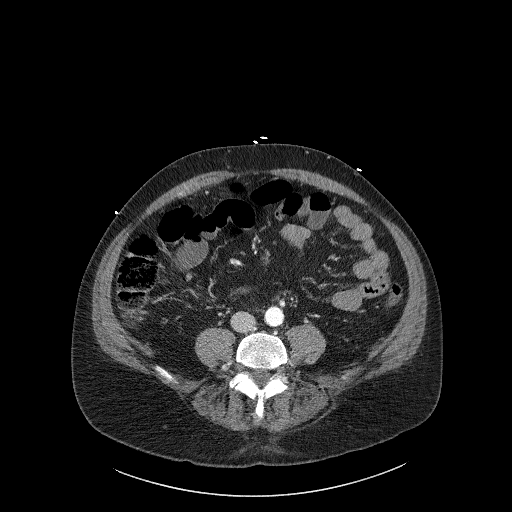
[im 104/220  soft-tissue]
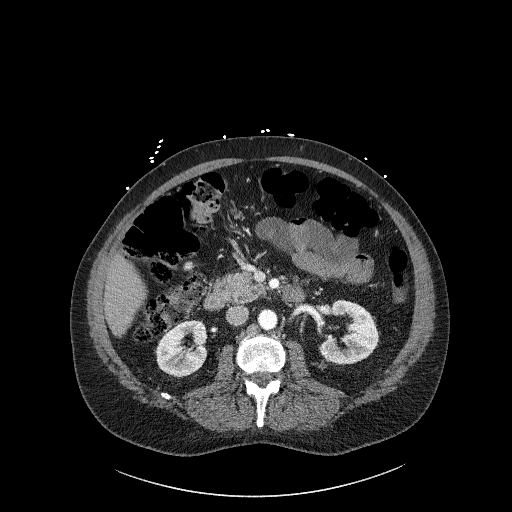
[im 116/220  soft-tissue]
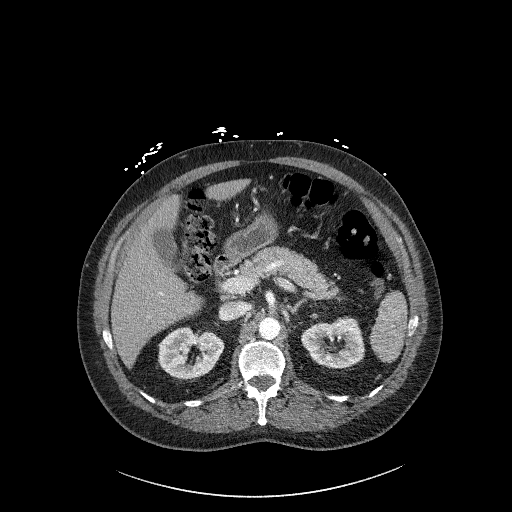
[im 139/220  soft-tissue]
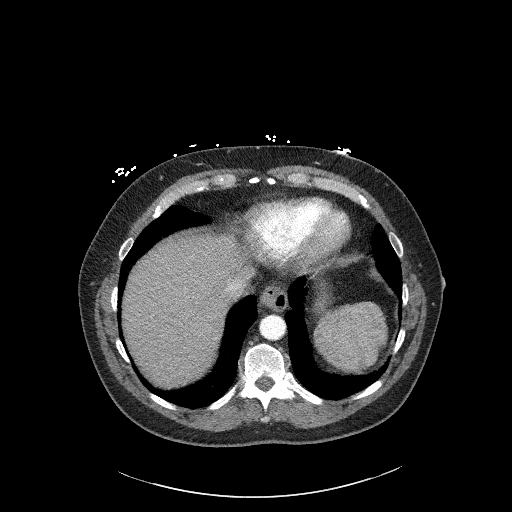
[im 162/220  soft-tissue]
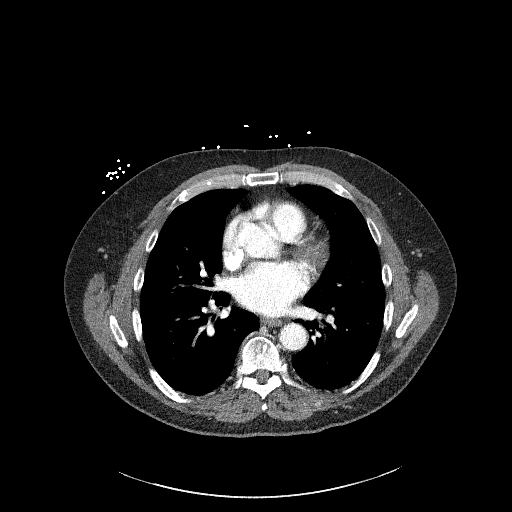
[im 185/220  soft-tissue]
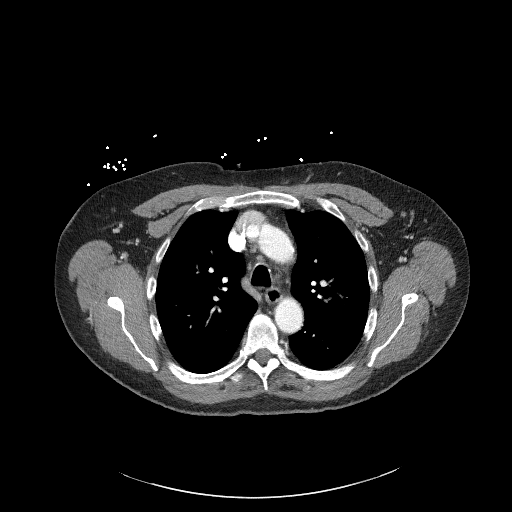
[im 185/220  bone]
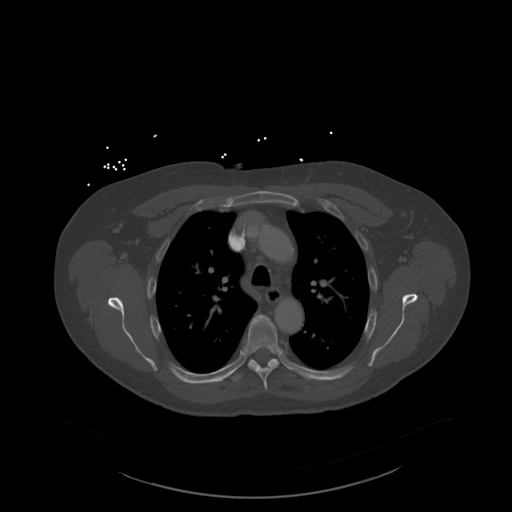
[im 208/220  soft-tissue]
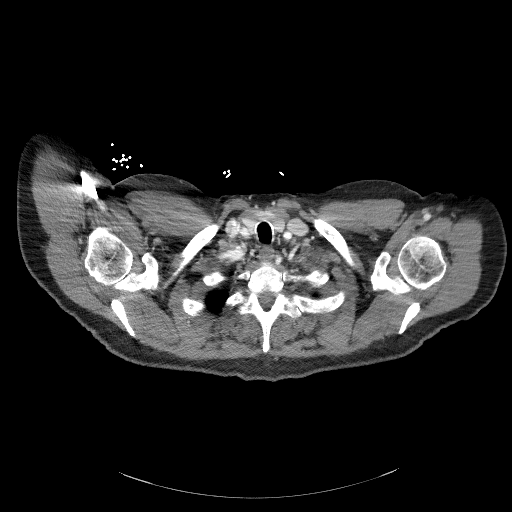

[Series 8: coronals · coronal · 0.79mm/px · 3 of 184 slices shown]
[im 46/184  soft-tissue]
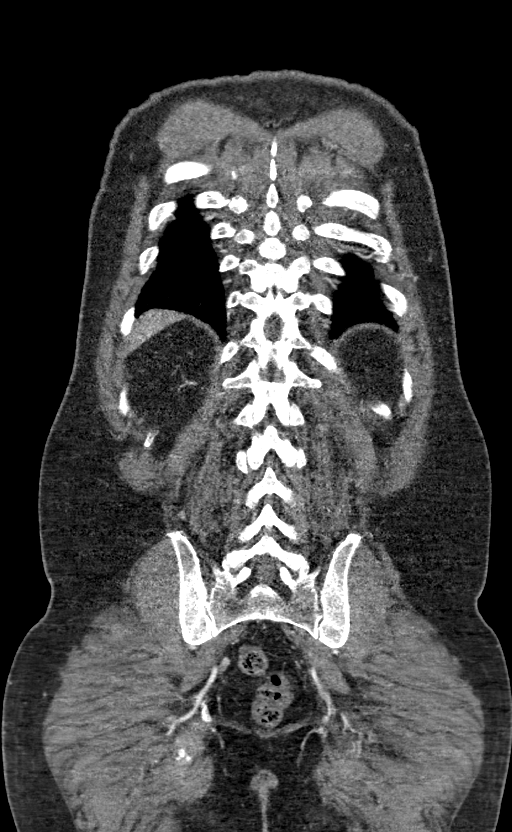
[im 92/184  soft-tissue]
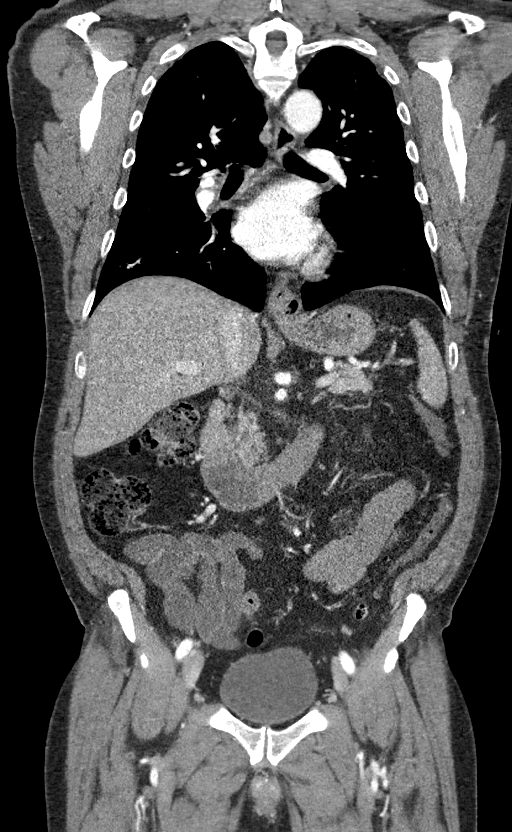
[im 138/184  soft-tissue]
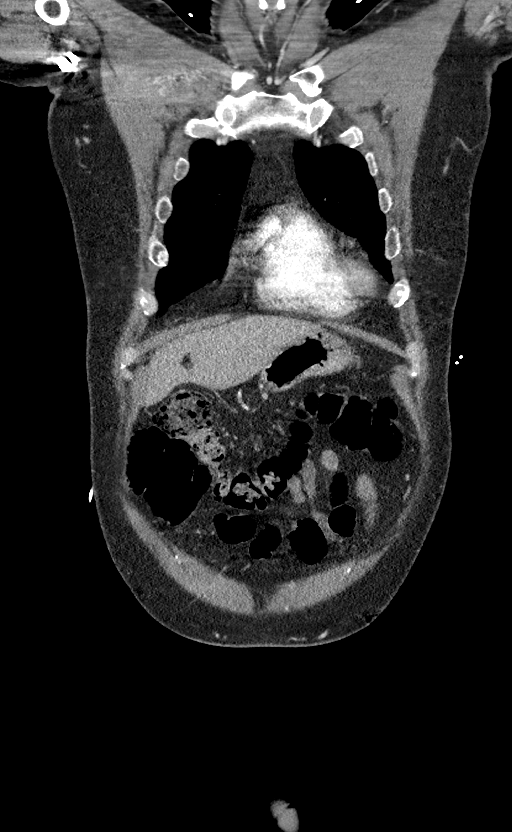

[13 of 46 positions shown; findings below may reference images not displayed]

Multidetector CT imaging through the chest, abdomen and pelvis was
performed using the standard protocol during bolus administration of
intravenous contrast. Multiplanar reconstructed images and MIPs were
obtained and reviewed to evaluate the vascular anatomy.

CONTRAST:  100mL OMNIPAQUE IOHEXOL 350 MG/ML SOLN
FINDINGS: CTA CHEST FINDINGS

Cardiovascular:

--Heart: The heart size is normal.  There is nopericardial effusion.

--Aorta: The course and caliber of the thoracic aorta are normal.
There is mild aortic atherosclerotic calcification. Precontrast
images show no aortic intramural hematoma. There is no blood pool,
dissection or penetrating ulcer demonstrated on arterial phase
postcontrast imaging. There is a conventional 3 vessel aortic arch
branching pattern. The proximal arch vessels are widely patent.

--Pulmonary Arteries: Contrast timing is optimized for preferential
opacification of the aorta. Within that limitation, normal central
pulmonary arteries.

Mediastinum/Nodes: No mediastinal, hilar or axillary
lymphadenopathy. The visualized thyroid and thoracic esophageal
course are unremarkable.

Lungs/Pleura: No pulmonary nodules or masses. No pleural effusion or
pneumothorax. No focal airspace consolidation. No focal pleural
abnormality.

Musculoskeletal: No chest wall abnormality. No acute osseous
findings.

Review of the MIP images confirms the above findings.

CTA ABDOMEN AND PELVIS FINDINGS

VASCULAR

Aorta: Normal caliber aorta without aneurysm, dissection, vasculitis
or hemodynamically significant stenosis. There is no aortic
atherosclerosis.

Celiac: No aneurysm, dissection or hemodynamically significant
stenosis. Normal branching pattern.

SMA: Widely patent without dissection or stenosis.

Renals: Single renal arteries bilaterally. No aneurysm, dissection,
stenosis or evidence of fibromuscular dysplasia.

IMA: Patent without abnormality.

Inflow: No aneurysm, stenosis or dissection.

Veins: Normal course and caliber of the major veins. Assessment is
otherwise limited by the arterial dominant contrast phase.

Review of the MIP images confirms the above findings.

NON-VASCULAR

Hepatobiliary: Normal hepatic contours and density. No visible
biliary dilatation. Normal gallbladder.

Pancreas: Normal contours without ductal dilatation. No
peripancreatic fluid collection.

Spleen: Normal arterial phase splenic enhancement pattern.

Adrenals/Urinary Tract:

--Adrenal glands: Normal.

--Right kidney/ureter: No hydronephrosis or perinephric stranding.
No nephrolithiasis. No obstructing ureteral stones.

--Left kidney/ureter: No hydronephrosis or perinephric stranding. No
nephrolithiasis. No obstructing ureteral stones.

--Urinary bladder: Unremarkable.

Stomach/Bowel:

--Stomach/Duodenum: No hiatal hernia or other gastric abnormality.
Normal duodenal course and caliber.

--Small bowel: No dilatation or inflammation.

--Colon: No focal abnormality.

--Appendix: Normal.

Lymphatic:  No abdominal or pelvic lymphadenopathy.

Reproductive: No free fluid in the pelvis.

Musculoskeletal. No bony spinal canal stenosis or focal osseous
abnormality.

Other: None.

Review of the MIP images confirms the above findings.

findings
IMPRESSION: No acute aortic syndrome or other acute abnormality of the chest,
abdomen or pelvis.

Aortic atherosclerosis ([id number]).

## 2024-08-06 ENCOUNTER — Ambulatory Visit
Admission: EM | Admit: 2024-08-06 | Discharge: 2024-08-06 | Disposition: A | Discharge status: Home / Self Care | Attending: Family Medicine | Admitting: Family Medicine

## 2024-08-06 ENCOUNTER — Ambulatory Visit (INDEPENDENT_AMBULATORY_CARE_PROVIDER_SITE_OTHER)

## 2024-08-06 ENCOUNTER — Other Ambulatory Visit: Payer: Self-pay

## 2024-08-06 DIAGNOSIS — R059 Cough, unspecified: Secondary | ICD-10-CM | POA: Diagnosis not present

## 2024-08-06 DIAGNOSIS — R6889 Other general symptoms and signs: Secondary | ICD-10-CM | POA: Diagnosis not present

## 2024-08-06 DIAGNOSIS — R052 Subacute cough: Secondary | ICD-10-CM | POA: Diagnosis not present

## 2024-08-06 LAB — POCT INFLUENZA A/B
Influenza A, POC: NEGATIVE
Influenza B, POC: NEGATIVE

## 2024-08-06 LAB — POC SOFIA SARS ANTIGEN FIA: SARS Coronavirus 2 Ag: NEGATIVE

## 2024-08-06 LAB — POCT RAPID STREP A (OFFICE): Rapid Strep A Screen: NEGATIVE

## 2024-08-06 NOTE — Discharge Instructions (Signed)
 May take over-the-counter cough and cold medicines Tylenol  or ibuprofen for any pain and fever Make sure you are drinking lots of fluids Your flu, strep, and COVID test are negative Chest x-ray is normal

## 2024-08-06 NOTE — ED Triage Notes (Signed)
 Pt presenting with c/o headache, chills, body ache, sore throat, non productive cough x 2 days. Pt stated that he took Aleve this AM for headache with minimal effectiveness.

## 2024-08-06 NOTE — ED Provider Notes (Signed)
 [facility]    CSN: [id number] Arrival date & time: 08/06/24  1713      History   Chief Complaint Chief Complaint  Patient presents with   Headache    HPI ROSALIE GELPI is a 68 y.o. male.   Patient has 2 issues.  First she has had a cough intermittently for a long very little other symptoms.  No fever chills headache or body ache.  No chest pain or shortness of breath.  Then, he went to a family gathering over the weekend and the last couple of days he has had fever sore throat body aches and chills.  He feels like he has the flu.  He was around a lot of family and children.    Past Medical History:  Diagnosis Date   Bleeding ulcer     There are no active problems to display for this patient.   Past Surgical History:  Procedure Laterality Date   bleeding ulcer cauterized  2012   VASECTOMY         Home Medications    Prior to Admission medications   Medication Sig Start Date End Date Taking? Authorizing Provider  atorvastatin (LIPITOR) 20 MG tablet Take 20 mg by mouth at bedtime. 12/27/21   [provider]  dutasteride (AVODART) 0.5 MG capsule Take 0.5 mg by mouth daily. 12/27/21   [provider]  naproxen sodium (ANAPROX) 220 MG tablet Take 220 mg by mouth 2 (two) times daily with a meal. Back pain    [provider]    Family History History reviewed. No pertinent family history.  Social History Social History   Tobacco Use   Smoking status: Former    Current packs/day: 0.00    Types: Cigars, Cigarettes    Quit date: 2014    Years since quitting: 11.9   Smokeless tobacco: Never   Tobacco comments:    one cigar a month  Vaping Use   Vaping status: Never Used  Substance Use Topics   Alcohol use: Yes    Comment: socially   Drug use: No     Allergies   Patient has no known allergies.   Review of Systems Review of Systems  See HPI Physical Exam Triage Vital Signs ED Triage Vitals  Encounter Vitals  Group     BP 08/06/24 1728 (!) 147/89     Girls Systolic BP Percentile --      Girls Diastolic BP Percentile --      Boys Systolic BP Percentile --      Boys Diastolic BP Percentile --      Pulse Rate 08/06/24 1728 70     Resp 08/06/24 1728 18     Temp 08/06/24 1728 99 F (37.2 C)     Temp Source 08/06/24 1728 Oral     SpO2 08/06/24 1728 96 %     Weight 08/06/24 1730 184 lb (83.5 kg)     Height 08/06/24 1730 5' 7 (1.702 m)     Head Circumference --      Peak Flow --      Pain Score 08/06/24 1730 8     Pain Loc --      Pain Education --      Exclude from Growth Chart --    No data found.  Updated Vital Signs BP (!) 147/89   Pulse 70   Temp 99 F (37.2 C) (Oral)   Resp 18   Ht 5' 7 (1.702 m)  Wt 83.5 kg   SpO2 96%   BMI 28.82 kg/m        Physical Exam Constitutional:      General: He is not in acute distress.    Appearance: He is well-developed and normal weight. He is not ill-appearing.  HENT:     Head: Normocephalic and atraumatic.     Right Ear: Tympanic membrane normal.     Left Ear: Tympanic membrane normal.     Nose: Congestion present.     Mouth/Throat:     Pharynx: Posterior oropharyngeal erythema present.  Eyes:     Conjunctiva/sclera: Conjunctivae normal.     Pupils: Pupils are equal, round, and reactive to light.  Cardiovascular:     Rate and Rhythm: Normal rate and regular rhythm.     Heart sounds: Normal heart sounds.  Pulmonary:     Effort: Pulmonary effort is normal. No respiratory distress.     Breath sounds: Rhonchi present.  Musculoskeletal:        General: Normal range of motion.     Cervical back: Normal range of motion and neck supple.  Skin:    General: Skin is warm and dry.  Neurological:     Mental Status: He is alert.      UC Treatments / Results  Labs (all labs ordered are listed, but only abnormal results are displayed) Labs Reviewed  POCT INFLUENZA A/B - Normal  POC SOFIA SARS ANTIGEN FIA - Normal  POCT RAPID STREP  A (OFFICE) - Normal    EKG   Radiology DG Chest 2 View Result Date: 08/06/2024 CLINICAL DATA:  Cough. EXAM: CHEST - 2 VIEW COMPARISON:  Chest radiograph dated 10/02/2023. FINDINGS: No focal consolidation, pleural effusion, pneumothorax. The cardiac silhouette is within normal limits. No acute osseous pathology. IMPRESSION: No active cardiopulmonary disease. Electronically Signed   By: Vanetta Chou M.D.   On: 08/06/2024 18:25    Procedures Procedures (including critical care time)  Medications Ordered in UC Medications - No data to display  Initial Impression / Assessment and Plan / UC Course  I have reviewed the triage vital signs and the nursing notes.  Pertinent labs & imaging results that were available during my care of the patient were reviewed by me and considered in my medical decision making (see chart for details).     Discussed home care for viral illness Final Clinical Impressions(s) / UC Diagnoses   Final diagnoses:  Subacute cough  Influenza-like symptoms     Discharge Instructions      May take over-the-counter cough and cold medicines Tylenol  or ibuprofen for any pain and fever Make sure you are drinking lots of fluids Your flu, strep, and COVID test are negative Chest x-ray is normal     ED Prescriptions   None    PDMP not reviewed this encounter.   Maranda Jamee Jacob, MD 08/06/24 [phone]
